# Patient Record
Sex: Female | Born: 2000 | Race: Black or African American | Hispanic: No | Marital: Single | Smoking: Never smoker
Health system: Southern US, Community
[De-identification: ages and names within clinical notes are randomized; demographics above are authoritative.]

## PROBLEM LIST (undated history)

## (undated) DIAGNOSIS — K429 Umbilical hernia without obstruction or gangrene: Secondary | ICD-10-CM

---
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
## 2019-04-18 ENCOUNTER — Other Ambulatory Visit: Payer: Self-pay

## 2019-04-18 ENCOUNTER — Emergency Department (HOSPITAL_COMMUNITY)
Admission: EM | Admit: 2019-04-18 | Discharge: 2019-04-18 | Disposition: A | Discharge status: Home / Self Care | Payer: BLUE CROSS/BLUE SHIELD | Attending: Emergency Medicine | Admitting: Emergency Medicine

## 2019-04-18 ENCOUNTER — Encounter (HOSPITAL_COMMUNITY): Payer: Self-pay | Admitting: Emergency Medicine

## 2019-04-18 ENCOUNTER — Emergency Department (HOSPITAL_COMMUNITY): Payer: BLUE CROSS/BLUE SHIELD

## 2019-04-18 DIAGNOSIS — Y999 Unspecified external cause status: Secondary | ICD-10-CM | POA: Insufficient documentation

## 2019-04-18 DIAGNOSIS — S61011A Laceration without foreign body of right thumb without damage to nail, initial encounter: Secondary | ICD-10-CM | POA: Insufficient documentation

## 2019-04-18 DIAGNOSIS — Y939 Activity, unspecified: Secondary | ICD-10-CM | POA: Diagnosis not present

## 2019-04-18 DIAGNOSIS — S61411A Laceration without foreign body of right hand, initial encounter: Secondary | ICD-10-CM

## 2019-04-18 DIAGNOSIS — S51811A Laceration without foreign body of right forearm, initial encounter: Secondary | ICD-10-CM | POA: Diagnosis not present

## 2019-04-18 DIAGNOSIS — Y929 Unspecified place or not applicable: Secondary | ICD-10-CM | POA: Insufficient documentation

## 2019-04-18 DIAGNOSIS — W25XXXA Contact with sharp glass, initial encounter: Secondary | ICD-10-CM | POA: Insufficient documentation

## 2019-04-18 DIAGNOSIS — Z79899 Other long term (current) drug therapy: Secondary | ICD-10-CM | POA: Insufficient documentation

## 2019-04-18 DIAGNOSIS — S6991XA Unspecified injury of right wrist, hand and finger(s), initial encounter: Secondary | ICD-10-CM | POA: Diagnosis present

## 2019-04-18 MED ORDER — LIDOCAINE HCL (PF) 1 % IJ SOLN
30.0000 mL | Freq: Once | INTRAMUSCULAR | Status: AC
  Administered 2019-04-18: 30 mL
  Filled 2019-04-18: qty 30

## 2019-04-18 NOTE — Discharge Instructions (Signed)
Thank you for allowing me to care for you today. Please return to the emergency department if you have new or worsening symptoms. Take your medications as instructed.  ° °

## 2019-04-18 NOTE — ED Notes (Signed)
Pt discharged from ED; instructions provided; Pt encouraged to return to ED if symptoms worsen and to f/u with PCP; Pt verbalized understanding of all instructions 

## 2019-04-18 NOTE — ED Triage Notes (Addendum)
Patient with laceration to right forearm and right thumb.  Bleeding controlled at this time.  Patient seems slightly confused at this time.  Patient states that she punched a glass window.

## 2019-04-18 NOTE — ED Provider Notes (Signed)
[facility] EMERGENCY DEPARTMENT Provider Note   CSN: [id number] Arrival date & time: 04/18/19  0359    History   Chief Complaint Chief Complaint  Patient presents with  . Laceration    HPI Kathleen Berry is a 18 y.o. female.     18 y/o F with no PMH presenting to the ED for laceration to the right arm and hand which occurred just prior to arrival. Patient is not very forthcoming with her story. She reports it was due to glass. After further questioning she states she punched through glass. Denies any other injuries. Denies SI, HI, drugs or alcohol. Reports feeling safe to return back to home. She provides no further details on her injury.      Past Medical History:  Diagnosis Date  . Umbilical hernia     There are no active problems to display for this patient.   History reviewed. No pertinent surgical history.   OB History   No obstetric history on file.      Home Medications    Prior to Admission medications   Medication Sig Start Date End Date Taking? Authorizing Provider  levonorgestrel (MIRENA) 20 MCG/24HR IUD 1 each by Intrauterine route once.   Yes [provider]  ketorolac (TORADOL) 10 MG tablet Take 1 tablet (10 mg total) by mouth every 8 (eight) hours as needed. Patient not taking: Reported on 04/18/2019 03/31/17   Rasch, Harolyn Rutherford, NP    Family History No family history on file.  Social History Social History   Tobacco Use  . Smoking status: Never Smoker  Substance Use Topics  . Alcohol use: No  . Drug use: Not on file     Allergies   Patient has no known allergies.   Review of Systems Review of Systems  Musculoskeletal: Negative for arthralgias and myalgias.  Skin: Positive for wound.  Psychiatric/Behavioral: Negative for suicidal ideas. The patient is not nervous/anxious.   All other systems reviewed and are negative.    Physical Exam Updated Vital Signs BP 119/76 (BP Location: Right Arm)   Pulse  84   Temp 98.2 F (36.8 C) (Oral)   Resp 16   LMP 03/19/2019 (Approximate)   SpO2 100%   Physical Exam Vitals signs and nursing note reviewed.  Constitutional:      Appearance: Normal appearance.  HENT:     Head: Normocephalic.  Eyes:     Conjunctiva/sclera: Conjunctivae normal.  Pulmonary:     Effort: Pulmonary effort is normal.  Musculoskeletal:     Right wrist: She exhibits laceration. She exhibits no bony tenderness and no effusion.     Left forearm: She exhibits laceration. She exhibits no tenderness, no bony tenderness, no swelling, no edema and no deformity.  Skin:    General: Skin is dry.     Comments: 3.5 cm laceration to the dorsum of the right distal forearm.  Smaller lacerations to the right thumb less than 1 cm each with multiple abrasions to the thumb  Neurological:     Mental Status: She is alert.  Psychiatric:        Mood and Affect: Mood normal.      ED Treatments / Results  Labs (all labs ordered are listed, but only abnormal results are displayed) Labs Reviewed - No data to display  EKG None  Radiology Dg Forearm Right  Result Date: 04/18/2019 CLINICAL DATA:  Forearm laceration EXAM: RIGHT FOREARM - 2 VIEW COMPARISON:  None. FINDINGS: Bandage over the dorsal  forearm. There is a 12 mm long curvilinear density overlapping the subcutaneous space on the AP view, positioned lateral to the distal radial shaft. This is not localized on the lateral view. Negative for fracture. IMPRESSION: 1. Unexpected shadow along the lateral and distal forearm which is not seen on the lateral view, bandage artifact versus foreign body, please correlate with exam. 2. Negative for fracture Electronically Signed   By: Marnee SpringJonathon  Watts M.D.   On: 04/18/2019 05:07   Dg Hand Complete Right  Result Date: 04/18/2019 CLINICAL DATA:  Laceration by glass EXAM: RIGHT HAND - COMPLETE 3+ VIEW COMPARISON:  None. FINDINGS: Limited at the level of the thumb due to bandage. No opaque foreign  body is seen. No fracture or dislocation. IMPRESSION: Artifact from bandage with no evidence of foreign body or fracture. Electronically Signed   By: Marnee SpringJonathon  Watts M.D.   On: 04/18/2019 05:04    Procedures .Marland Kitchen.Laceration Repair Date/Time: 04/18/2019 9:38 AM Performed by: Arlyn DunningMcLean, Lania Zawistowski A, PA-C Authorized by: Arlyn DunningMcLean, Babyboy Loya A, PA-C   Consent:    Consent obtained:  Verbal   Consent given by:  Patient   Risks discussed:  Infection, pain, poor cosmetic result and vascular damage   Alternatives discussed:  No treatment and delayed treatment Anesthesia (see MAR for exact dosages):    Anesthesia method:  Local infiltration   Local anesthetic:  Lidocaine 1% w/o epi Laceration details:    Location:  Finger (finger and forarm. multiple)   Finger location:  R thumb   Wound length (cm): total of 5.2cm.   Depth (mm):  5 Pre-procedure details:    Preparation:  Patient was prepped and draped in usual sterile fashion Exploration:    Hemostasis achieved with:  Direct pressure   Wound exploration: wound explored through full range of motion     Wound extent: underlying fracture     Wound extent: no fascia violation noted, no foreign bodies/material noted, no muscle damage noted, no nerve damage noted, no tendon damage noted and no vascular damage noted     Contaminated: no   Treatment:    Area cleansed with:  Betadine   Amount of cleaning:  Extensive   Irrigation solution:  Sterile saline   Irrigation method:  Syringe Skin repair:    Repair method:  Sutures   Suture size:  4-0   Suture material:  Nylon   Suture technique:  Simple interrupted   Number of sutures:  16 Approximation:    Approximation:  Close Post-procedure details:    Dressing:  Non-adherent dressing   Patient tolerance of procedure:  Tolerated well, no immediate complications   (including critical care time)  Medications Ordered in ED Medications  lidocaine (PF) (XYLOCAINE) 1 % injection 30 mL (has no administration in  time range)     Initial Impression / Assessment and Plan / ED Course  I have reviewed the triage vital signs and the nursing notes.  Pertinent labs & imaging results that were available during my care of the patient were reviewed by me and considered in my medical decision making (see chart for details).  Clinical Course as of Apr 17 940  Tue Apr 18, 2019  0940 Patient asked multiple times if she would like to speak with anyone (psych, police) regarding her injury and how it occurred. She declined and stated that it was an accident and she feels safe going home.    [KM]    Clinical Course User Index [KM] Arlyn DunningMcLean, Samyak Sackmann A, PA-C  Final Clinical Impressions(s) / ED Diagnoses   Final diagnoses:  Laceration of right hand without foreign body, initial encounter  Laceration of right forearm, initial encounter    ED Discharge Orders    None       Jeral Pinch 04/18/19 0941    Benjiman Core, MD 04/18/19 1510

## 2020-11-19 IMAGING — DX RIGHT HAND - COMPLETE 3+ VIEW
3 series · 3 of 3 positions shown · non-contrast
Comparison: None.

CLINICAL DATA: Laceration by glass

EXAM:
RIGHT HAND - COMPLETE 3+ VIEW

[hand pa]
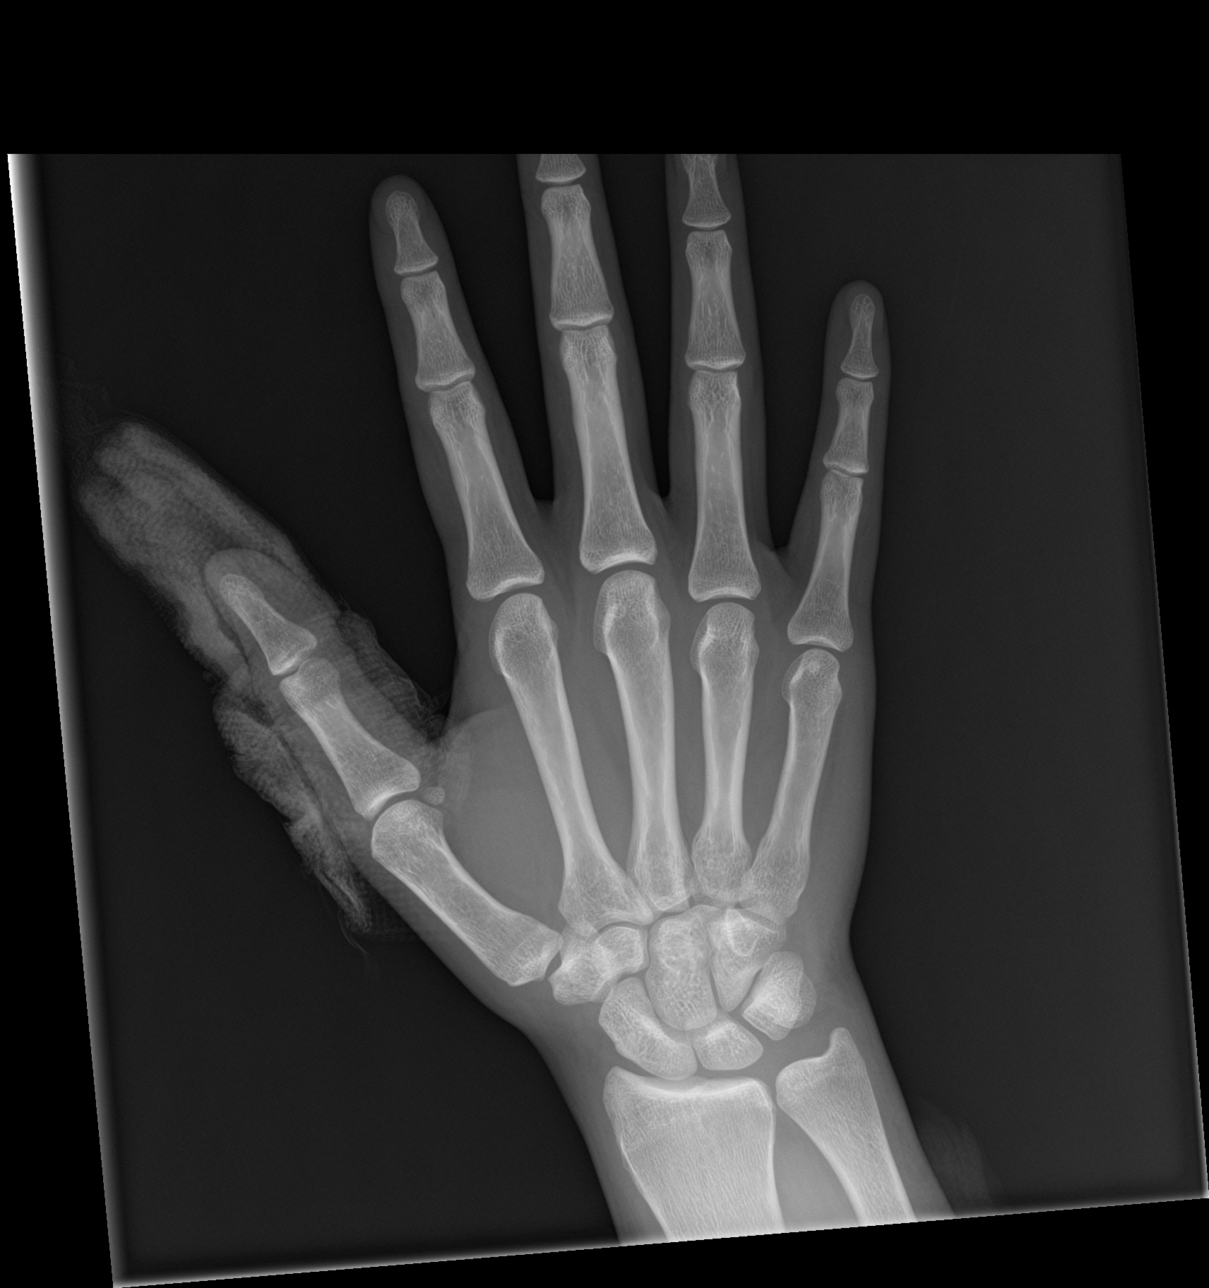

[hand obl]
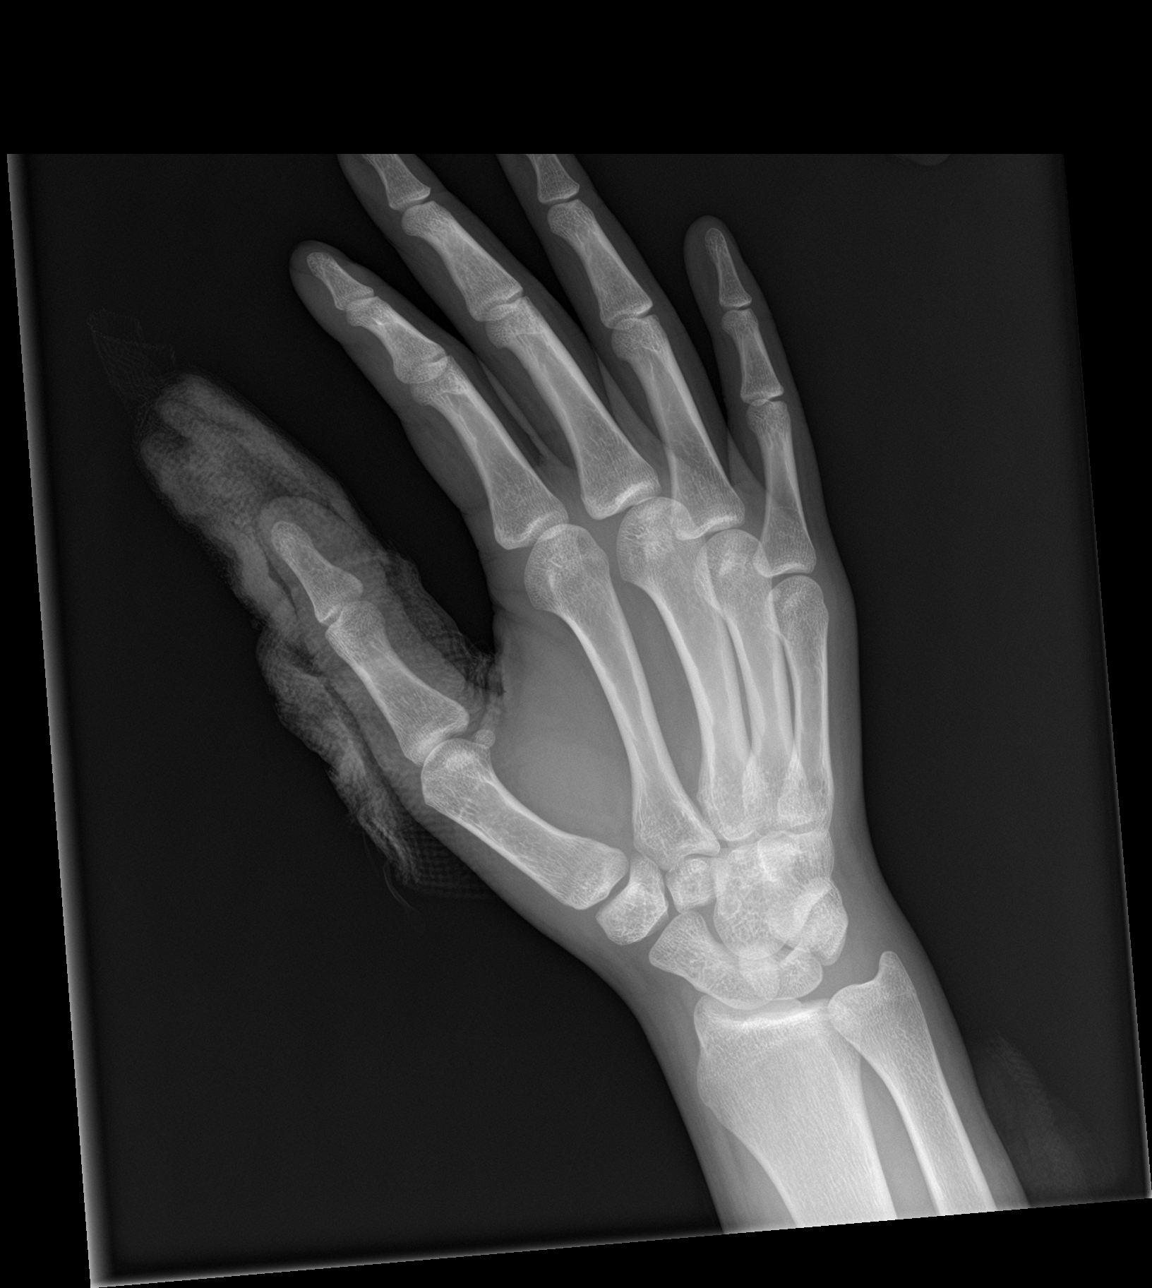

[hand lat]
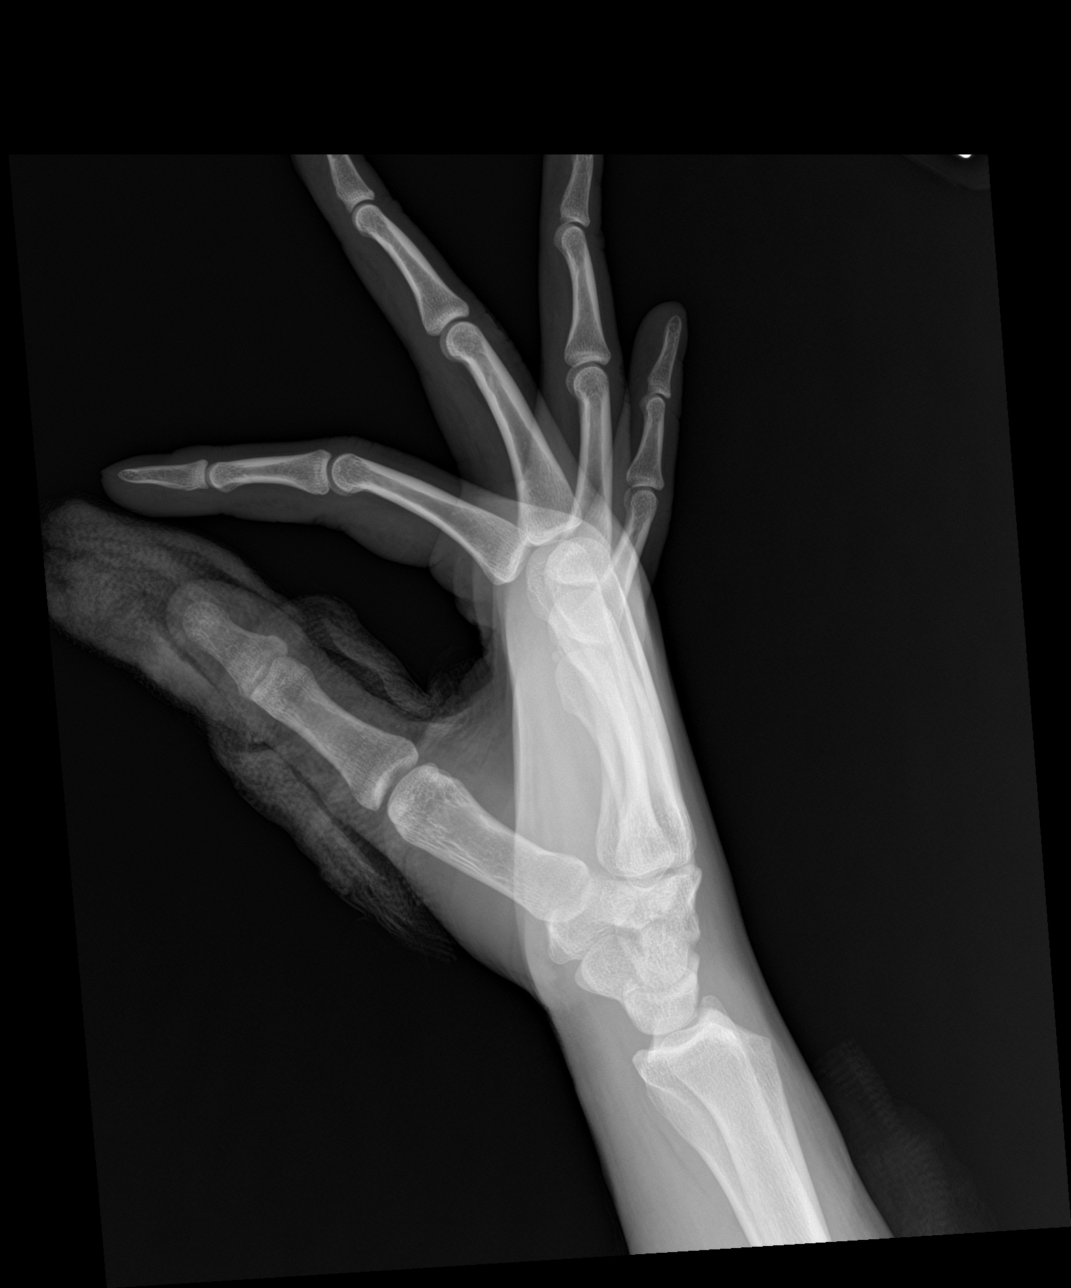

[3 of 3 positions shown; findings below may reference images not displayed]

FINDINGS: Limited at the level of the thumb due to bandage. No opaque foreign
body is seen. No fracture or dislocation.
IMPRESSION: Artifact from bandage with no evidence of foreign body or fracture.
